# Patient Record
Sex: Male | Born: 1975 | Race: White | Hispanic: No | Marital: Married
Health system: Southern US, Community
[De-identification: ages and names within clinical notes are randomized; demographics above are authoritative.]

---

## 2007-03-03 ENCOUNTER — Encounter: Admission: RE | Admit: 2007-03-03 | Discharge: 2007-03-03 | Payer: Self-pay | Admitting: Internal Medicine

## 2016-04-21 DIAGNOSIS — D2262 Melanocytic nevi of left upper limb, including shoulder: Secondary | ICD-10-CM | POA: Diagnosis not present

## 2016-04-21 DIAGNOSIS — D2261 Melanocytic nevi of right upper limb, including shoulder: Secondary | ICD-10-CM | POA: Diagnosis not present

## 2016-04-21 DIAGNOSIS — D225 Melanocytic nevi of trunk: Secondary | ICD-10-CM | POA: Diagnosis not present

## 2016-07-25 DIAGNOSIS — Z0000 Encounter for general adult medical examination without abnormal findings: Secondary | ICD-10-CM | POA: Diagnosis not present

## 2016-08-01 DIAGNOSIS — R030 Elevated blood-pressure reading, without diagnosis of hypertension: Secondary | ICD-10-CM | POA: Diagnosis not present

## 2016-08-01 DIAGNOSIS — Z1389 Encounter for screening for other disorder: Secondary | ICD-10-CM | POA: Diagnosis not present

## 2016-08-01 DIAGNOSIS — Z125 Encounter for screening for malignant neoplasm of prostate: Secondary | ICD-10-CM | POA: Diagnosis not present

## 2016-08-01 DIAGNOSIS — R7309 Other abnormal glucose: Secondary | ICD-10-CM | POA: Diagnosis not present

## 2016-08-01 DIAGNOSIS — Z0000 Encounter for general adult medical examination without abnormal findings: Secondary | ICD-10-CM | POA: Diagnosis not present

## 2016-08-04 DIAGNOSIS — Z1212 Encounter for screening for malignant neoplasm of rectum: Secondary | ICD-10-CM | POA: Diagnosis not present

## 2017-01-30 DIAGNOSIS — Z8249 Family history of ischemic heart disease and other diseases of the circulatory system: Secondary | ICD-10-CM | POA: Diagnosis not present

## 2017-01-30 DIAGNOSIS — E7849 Other hyperlipidemia: Secondary | ICD-10-CM | POA: Diagnosis not present

## 2017-01-30 DIAGNOSIS — Z23 Encounter for immunization: Secondary | ICD-10-CM | POA: Diagnosis not present

## 2017-02-03 ENCOUNTER — Other Ambulatory Visit: Payer: Self-pay | Admitting: Internal Medicine

## 2017-02-03 DIAGNOSIS — E785 Hyperlipidemia, unspecified: Secondary | ICD-10-CM

## 2017-02-03 DIAGNOSIS — Z8249 Family history of ischemic heart disease and other diseases of the circulatory system: Secondary | ICD-10-CM

## 2017-02-09 ENCOUNTER — Inpatient Hospital Stay
Admission: RE | Admit: 2017-02-09 | Discharge: 2017-02-09 | Disposition: A | Discharge status: Home / Self Care | Payer: Self-pay | Source: Ambulatory Visit | Attending: Internal Medicine | Admitting: Internal Medicine

## 2017-02-23 ENCOUNTER — Ambulatory Visit
Admission: RE | Admit: 2017-02-23 | Discharge: 2017-02-23 | Disposition: A | Discharge status: Home / Self Care | Payer: No Typology Code available for payment source | Source: Ambulatory Visit | Attending: Internal Medicine | Admitting: Internal Medicine

## 2017-02-23 DIAGNOSIS — Z8249 Family history of ischemic heart disease and other diseases of the circulatory system: Secondary | ICD-10-CM

## 2017-02-23 DIAGNOSIS — E785 Hyperlipidemia, unspecified: Secondary | ICD-10-CM

## 2017-02-26 DIAGNOSIS — R030 Elevated blood-pressure reading, without diagnosis of hypertension: Secondary | ICD-10-CM | POA: Diagnosis not present

## 2017-02-27 DIAGNOSIS — R030 Elevated blood-pressure reading, without diagnosis of hypertension: Secondary | ICD-10-CM | POA: Diagnosis not present

## 2017-04-27 DIAGNOSIS — E7849 Other hyperlipidemia: Secondary | ICD-10-CM | POA: Diagnosis not present

## 2017-08-04 DIAGNOSIS — E038 Other specified hypothyroidism: Secondary | ICD-10-CM | POA: Diagnosis not present

## 2017-08-04 DIAGNOSIS — R82998 Other abnormal findings in urine: Secondary | ICD-10-CM | POA: Diagnosis not present

## 2017-08-04 DIAGNOSIS — Z0000 Encounter for general adult medical examination without abnormal findings: Secondary | ICD-10-CM | POA: Diagnosis not present

## 2017-08-04 DIAGNOSIS — R7309 Other abnormal glucose: Secondary | ICD-10-CM | POA: Diagnosis not present

## 2017-08-11 DIAGNOSIS — R7309 Other abnormal glucose: Secondary | ICD-10-CM | POA: Diagnosis not present

## 2017-08-11 DIAGNOSIS — Z8249 Family history of ischemic heart disease and other diseases of the circulatory system: Secondary | ICD-10-CM | POA: Diagnosis not present

## 2017-08-11 DIAGNOSIS — Z1389 Encounter for screening for other disorder: Secondary | ICD-10-CM | POA: Diagnosis not present

## 2017-08-11 DIAGNOSIS — Z0000 Encounter for general adult medical examination without abnormal findings: Secondary | ICD-10-CM | POA: Diagnosis not present

## 2018-02-19 DIAGNOSIS — E038 Other specified hypothyroidism: Secondary | ICD-10-CM | POA: Diagnosis not present

## 2018-02-19 DIAGNOSIS — I10 Essential (primary) hypertension: Secondary | ICD-10-CM | POA: Diagnosis not present

## 2018-02-19 DIAGNOSIS — Z1389 Encounter for screening for other disorder: Secondary | ICD-10-CM | POA: Diagnosis not present

## 2018-06-24 IMAGING — CT CT HEART SCORING
2 of 3 series · 14 of 20 positions shown, 17 images · non-contrast
Comparison: None.

CLINICAL DATA: 41-year-old Caucasian male with family history of
heart disease. Former smoker.

EXAM:
CT HEART FOR CALCIUM SCORING
TECHNIQUE: CT heart was performed on a 64 channel system using prospective ECG
gating.
A non-contrast exam for calcium scoring was performed.
Note that this exam targets the heart and the chest was not imaged
in its entirety.

[Series 2: smartscore - gated 0.4 sec · axial · 0.48mm/px · z∈[-172,-112]mm · 3 of 48 slices shown]
[im 12/48  vessel]
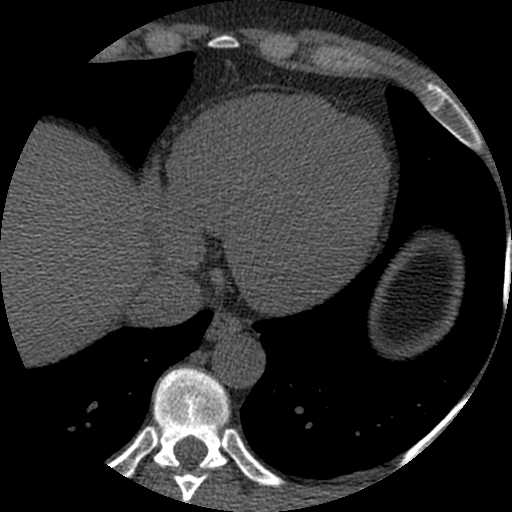
[im 24/48  vessel]
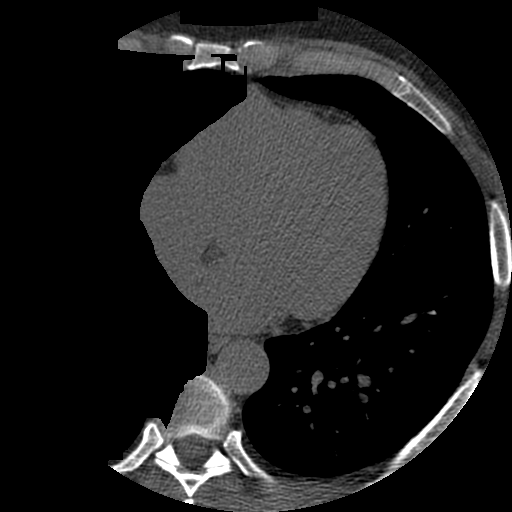
[im 36/48  vessel]
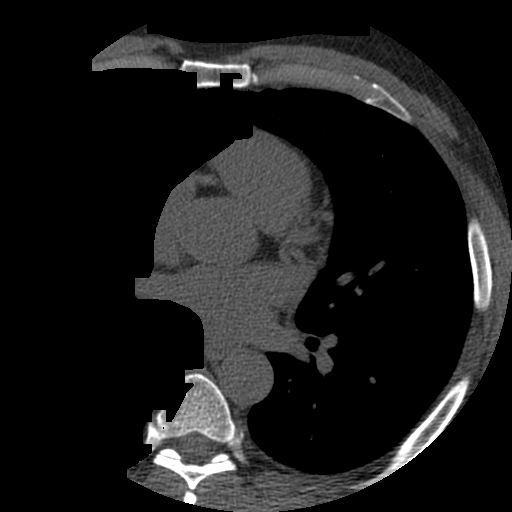

[Series 501: sag · sagittal · 0.70mm/px · 11 of 140 slices shown, 14 images]
[im 12/140  vessel]
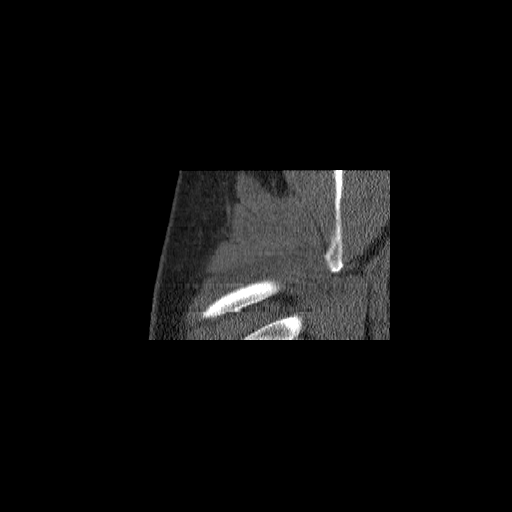
[im 12/140  lung]
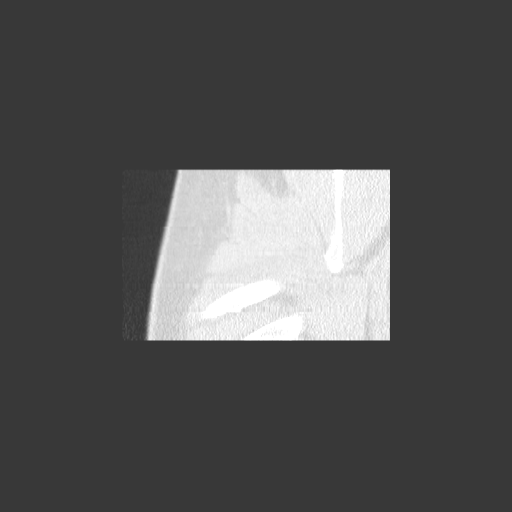
[im 24/140  vessel]
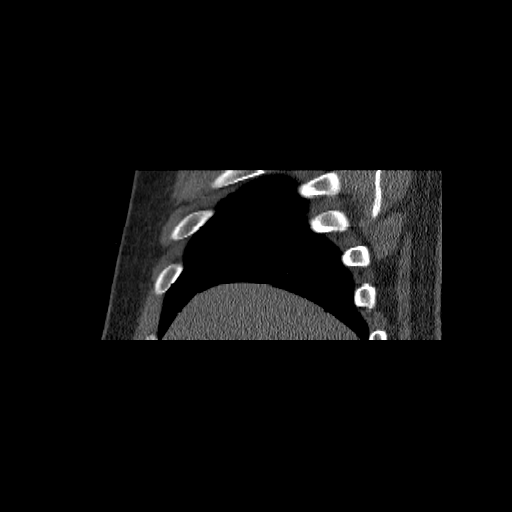
[im 35/140  vessel]
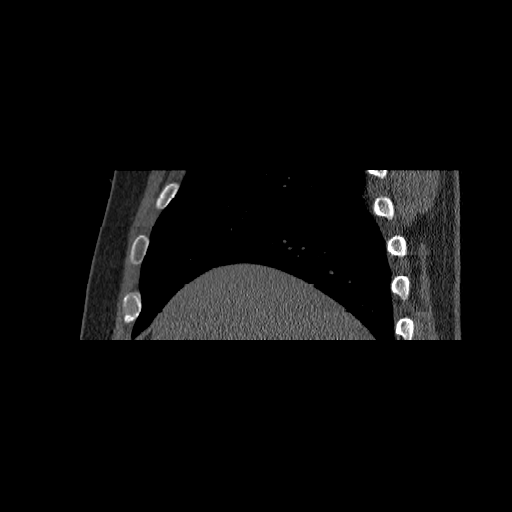
[im 47/140  vessel]
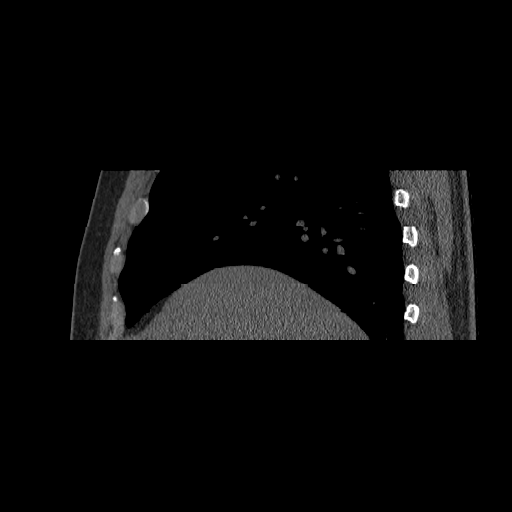
[im 58/140  vessel]
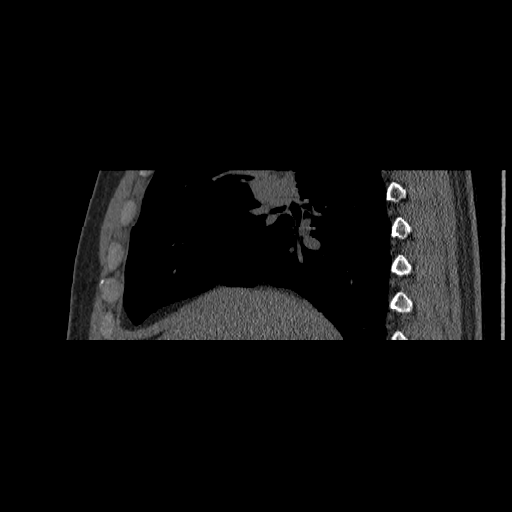
[im 58/140  lung]
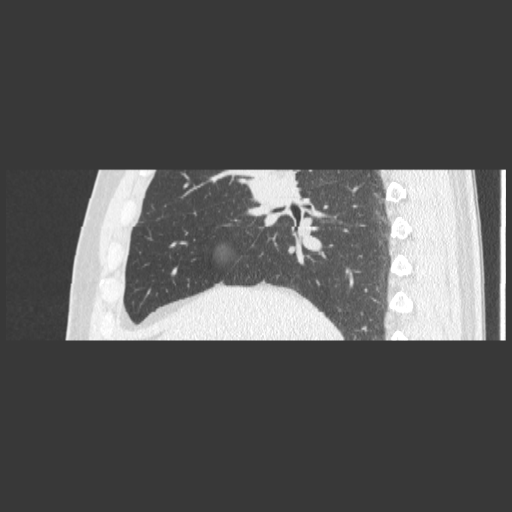
[im 70/140  vessel]
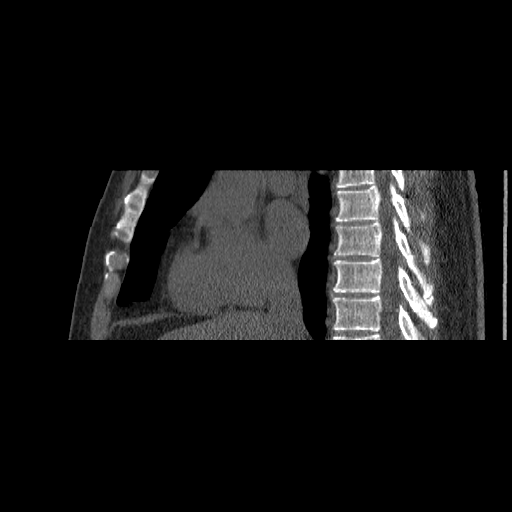
[im 82/140  vessel]
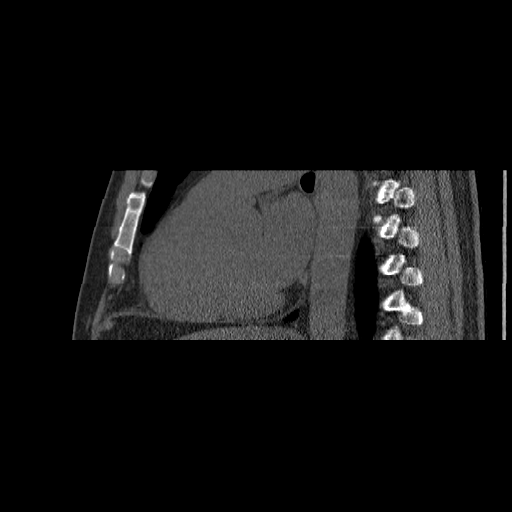
[im 93/140  vessel]
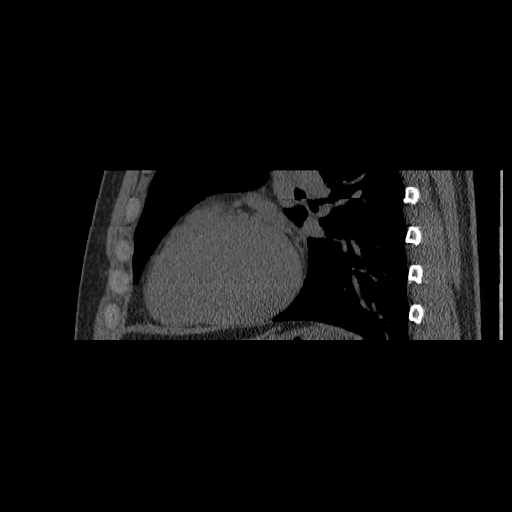
[im 105/140  vessel]
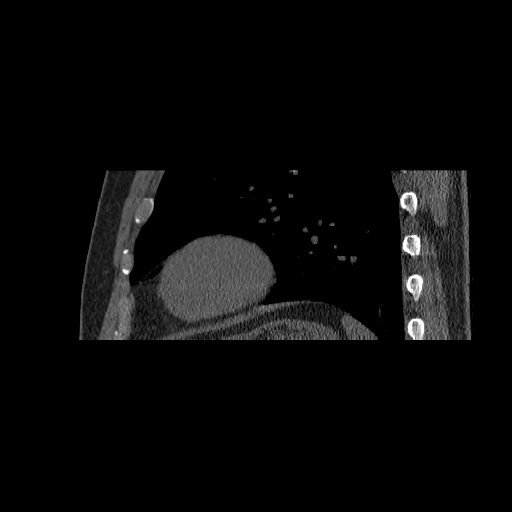
[im 105/140  lung]
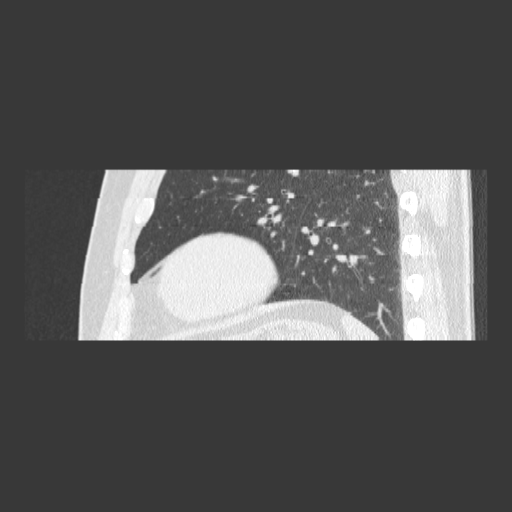
[im 116/140  vessel]
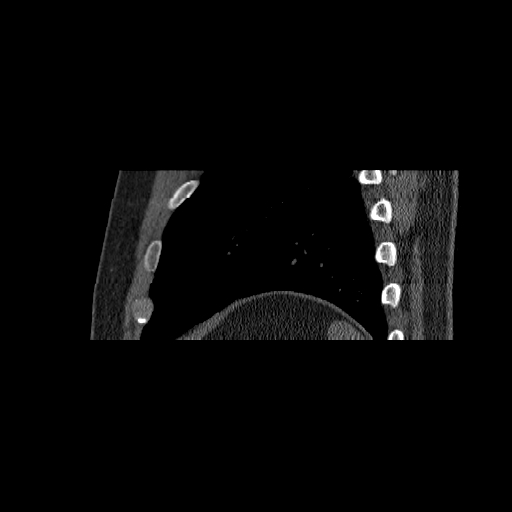
[im 128/140  vessel]
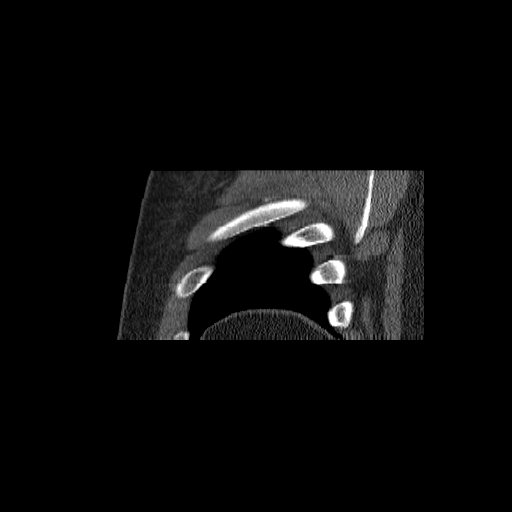

[14 of 20 positions shown; findings below may reference images not displayed]

FINDINGS: Technical quality: Good.

CORONARY CALCIUM

Total Agatston Score: 0

[HOSPITAL] percentile:  N/A

OTHER FINDINGS:

Within the visualized portions of the thorax there are no suspicious
appearing pulmonary nodules or masses, there is no acute
consolidative airspace disease, no pleural effusions, no
pneumothorax and no lymphadenopathy. Visualized portions of the
upper abdomen are unremarkable. There are no aggressive appearing
lytic or blastic lesions noted in the visualized portions of the
skeleton.
IMPRESSION: 1. Patient's total coronary artery calcium score is 0. This
indicates a very low (but non-zero) risk of major adverse
cardiovascular events over the next 10 years.
2. No significant incidental noncardiac findings are noted.

## 2018-08-09 DIAGNOSIS — R82998 Other abnormal findings in urine: Secondary | ICD-10-CM | POA: Diagnosis not present

## 2018-08-09 DIAGNOSIS — Z125 Encounter for screening for malignant neoplasm of prostate: Secondary | ICD-10-CM | POA: Diagnosis not present

## 2018-08-09 DIAGNOSIS — Z0000 Encounter for general adult medical examination without abnormal findings: Secondary | ICD-10-CM | POA: Diagnosis not present

## 2019-05-09 ENCOUNTER — Ambulatory Visit: Payer: No Typology Code available for payment source | Attending: Internal Medicine

## 2019-05-09 DIAGNOSIS — Z23 Encounter for immunization: Secondary | ICD-10-CM | POA: Insufficient documentation

## 2019-05-09 NOTE — Progress Notes (Signed)
   Covid-19 Vaccination [facility]  Name:  Seth Church    MRN: [id number] DOB: 09-24-1975  05/09/2019  Seth Church was observed post Covid-19 immunization for 15 minutes without incidence. He was provided with Vaccine Information Sheet and instruction to access the V-Safe system.   Seth Church was instructed to call 911 with any severe reactions post vaccine: Marland Kitchen Difficulty breathing  . Swelling of your face and throat  . A fast heartbeat  . A bad rash all over your body  . Dizziness and weakness    Immunizations Administered    Name Date Dose VIS Date Route   Pfizer COVID-19 Vaccine 05/09/2019  6:46 PM 0.3 mL 03/11/2019 Intramuscular   Manufacturer: [place]   Lot: [id number]   [place]: [id number]

## 2019-06-03 ENCOUNTER — Ambulatory Visit: Payer: Self-pay | Attending: Internal Medicine

## 2019-06-03 DIAGNOSIS — Z23 Encounter for immunization: Secondary | ICD-10-CM | POA: Insufficient documentation

## 2019-06-03 NOTE — Progress Notes (Signed)
   Covid-19 Vaccination Clinic  Name:  Khary Sielski    MRN: [id number] DOB: 1975-12-31  06/03/2019  Mr. Pons was observed post Covid-19 immunization for 15 minutes without incident. He was provided with Vaccine Information Sheet and instruction to access the V-Safe system.   Mr. Gerard was instructed to call 911 with any severe reactions post vaccine: Marland Kitchen Difficulty breathing  . Swelling of face and throat  . A fast heartbeat  . A bad rash all over body  . Dizziness and weakness   Immunizations Administered    Name Date Dose VIS Date Route   Pfizer COVID-19 Vaccine 06/03/2019  6:52 PM 0.3 mL 03/11/2019 Intramuscular   Manufacturer: [place]   Lot: [id number]   [place]: [id number]

## 2020-09-17 DIAGNOSIS — E785 Hyperlipidemia, unspecified: Secondary | ICD-10-CM | POA: Diagnosis not present

## 2020-09-17 DIAGNOSIS — E039 Hypothyroidism, unspecified: Secondary | ICD-10-CM | POA: Diagnosis not present

## 2020-09-17 DIAGNOSIS — Z125 Encounter for screening for malignant neoplasm of prostate: Secondary | ICD-10-CM | POA: Diagnosis not present

## 2020-09-17 DIAGNOSIS — M25511 Pain in right shoulder: Secondary | ICD-10-CM | POA: Diagnosis not present

## 2020-09-21 DIAGNOSIS — Z0000 Encounter for general adult medical examination without abnormal findings: Secondary | ICD-10-CM | POA: Diagnosis not present

## 2020-09-21 DIAGNOSIS — I10 Essential (primary) hypertension: Secondary | ICD-10-CM | POA: Diagnosis not present

## 2020-09-21 DIAGNOSIS — R82998 Other abnormal findings in urine: Secondary | ICD-10-CM | POA: Diagnosis not present

## 2020-10-22 DIAGNOSIS — D225 Melanocytic nevi of trunk: Secondary | ICD-10-CM | POA: Diagnosis not present

## 2020-10-22 DIAGNOSIS — D2361 Other benign neoplasm of skin of right upper limb, including shoulder: Secondary | ICD-10-CM | POA: Diagnosis not present

## 2020-10-22 DIAGNOSIS — L821 Other seborrheic keratosis: Secondary | ICD-10-CM | POA: Diagnosis not present

## 2021-04-08 DIAGNOSIS — R739 Hyperglycemia, unspecified: Secondary | ICD-10-CM | POA: Diagnosis not present

## 2021-04-08 DIAGNOSIS — E039 Hypothyroidism, unspecified: Secondary | ICD-10-CM | POA: Diagnosis not present

## 2021-04-08 DIAGNOSIS — I10 Essential (primary) hypertension: Secondary | ICD-10-CM | POA: Diagnosis not present

## 2021-08-27 DIAGNOSIS — H7291 Unspecified perforation of tympanic membrane, right ear: Secondary | ICD-10-CM | POA: Diagnosis not present

## 2021-08-27 DIAGNOSIS — I10 Essential (primary) hypertension: Secondary | ICD-10-CM | POA: Diagnosis not present

## 2021-08-29 DIAGNOSIS — S00411A Abrasion of right ear, initial encounter: Secondary | ICD-10-CM | POA: Diagnosis not present

## 2021-08-29 DIAGNOSIS — H9221 Otorrhagia, right ear: Secondary | ICD-10-CM | POA: Diagnosis not present

## 2021-08-29 DIAGNOSIS — H9201 Otalgia, right ear: Secondary | ICD-10-CM | POA: Diagnosis not present

## 2021-10-11 DIAGNOSIS — E039 Hypothyroidism, unspecified: Secondary | ICD-10-CM | POA: Diagnosis not present

## 2021-10-11 DIAGNOSIS — R739 Hyperglycemia, unspecified: Secondary | ICD-10-CM | POA: Diagnosis not present

## 2021-10-11 DIAGNOSIS — E785 Hyperlipidemia, unspecified: Secondary | ICD-10-CM | POA: Diagnosis not present

## 2021-10-11 DIAGNOSIS — Z125 Encounter for screening for malignant neoplasm of prostate: Secondary | ICD-10-CM | POA: Diagnosis not present

## 2021-10-29 DIAGNOSIS — Z0000 Encounter for general adult medical examination without abnormal findings: Secondary | ICD-10-CM | POA: Diagnosis not present

## 2021-10-29 DIAGNOSIS — Z1339 Encounter for screening examination for other mental health and behavioral disorders: Secondary | ICD-10-CM | POA: Diagnosis not present

## 2021-10-29 DIAGNOSIS — I10 Essential (primary) hypertension: Secondary | ICD-10-CM | POA: Diagnosis not present

## 2021-10-29 DIAGNOSIS — Z1331 Encounter for screening for depression: Secondary | ICD-10-CM | POA: Diagnosis not present

## 2022-11-06 DIAGNOSIS — E039 Hypothyroidism, unspecified: Secondary | ICD-10-CM | POA: Diagnosis not present

## 2022-11-06 DIAGNOSIS — R739 Hyperglycemia, unspecified: Secondary | ICD-10-CM | POA: Diagnosis not present

## 2022-11-06 DIAGNOSIS — E785 Hyperlipidemia, unspecified: Secondary | ICD-10-CM | POA: Diagnosis not present

## 2022-11-13 DIAGNOSIS — Z1331 Encounter for screening for depression: Secondary | ICD-10-CM | POA: Diagnosis not present

## 2022-11-13 DIAGNOSIS — Z0000 Encounter for general adult medical examination without abnormal findings: Secondary | ICD-10-CM | POA: Diagnosis not present

## 2022-11-13 DIAGNOSIS — I10 Essential (primary) hypertension: Secondary | ICD-10-CM | POA: Diagnosis not present

## 2022-11-13 DIAGNOSIS — R82998 Other abnormal findings in urine: Secondary | ICD-10-CM | POA: Diagnosis not present

## 2022-11-13 DIAGNOSIS — Z1339 Encounter for screening examination for other mental health and behavioral disorders: Secondary | ICD-10-CM | POA: Diagnosis not present

## 2023-03-12 DIAGNOSIS — Z860101 Personal history of adenomatous and serrated colon polyps: Secondary | ICD-10-CM | POA: Diagnosis not present

## 2023-03-12 DIAGNOSIS — Z09 Encounter for follow-up examination after completed treatment for conditions other than malignant neoplasm: Secondary | ICD-10-CM | POA: Diagnosis not present

## 2023-03-12 DIAGNOSIS — D124 Benign neoplasm of descending colon: Secondary | ICD-10-CM | POA: Diagnosis not present

## 2023-03-12 DIAGNOSIS — K5730 Diverticulosis of large intestine without perforation or abscess without bleeding: Secondary | ICD-10-CM | POA: Diagnosis not present

## 2023-11-18 DIAGNOSIS — Z125 Encounter for screening for malignant neoplasm of prostate: Secondary | ICD-10-CM | POA: Diagnosis not present

## 2023-11-18 DIAGNOSIS — E039 Hypothyroidism, unspecified: Secondary | ICD-10-CM | POA: Diagnosis not present

## 2023-11-18 DIAGNOSIS — R739 Hyperglycemia, unspecified: Secondary | ICD-10-CM | POA: Diagnosis not present

## 2023-11-18 DIAGNOSIS — I10 Essential (primary) hypertension: Secondary | ICD-10-CM | POA: Diagnosis not present

## 2023-11-26 DIAGNOSIS — Z23 Encounter for immunization: Secondary | ICD-10-CM | POA: Diagnosis not present

## 2023-11-26 DIAGNOSIS — Z1389 Encounter for screening for other disorder: Secondary | ICD-10-CM | POA: Diagnosis not present

## 2023-11-26 DIAGNOSIS — R82998 Other abnormal findings in urine: Secondary | ICD-10-CM | POA: Diagnosis not present

## 2023-11-26 DIAGNOSIS — Z1331 Encounter for screening for depression: Secondary | ICD-10-CM | POA: Diagnosis not present

## 2023-11-26 DIAGNOSIS — Z0000 Encounter for general adult medical examination without abnormal findings: Secondary | ICD-10-CM | POA: Diagnosis not present

## 2023-11-26 DIAGNOSIS — I10 Essential (primary) hypertension: Secondary | ICD-10-CM | POA: Diagnosis not present
